# Patient Record
Sex: Male | Born: 1990 | Race: Black or African American | Hispanic: No | Marital: Single | Smoking: Never smoker
Health system: Southern US, Community
[De-identification: ages and names within clinical notes are randomized; demographics above are authoritative.]

---

## 2013-05-25 ENCOUNTER — Emergency Department (HOSPITAL_COMMUNITY)
Admission: EM | Admit: 2013-05-25 | Discharge: 2013-05-25 | Disposition: A | Discharge status: Home / Self Care | Payer: No Typology Code available for payment source | Attending: Emergency Medicine | Admitting: Emergency Medicine

## 2013-05-25 ENCOUNTER — Emergency Department (HOSPITAL_COMMUNITY): Payer: No Typology Code available for payment source

## 2013-05-25 ENCOUNTER — Encounter (HOSPITAL_COMMUNITY): Payer: Self-pay | Admitting: Emergency Medicine

## 2013-05-25 DIAGNOSIS — S0083XA Contusion of other part of head, initial encounter: Secondary | ICD-10-CM

## 2013-05-25 DIAGNOSIS — S060X9A Concussion with loss of consciousness of unspecified duration, initial encounter: Secondary | ICD-10-CM | POA: Insufficient documentation

## 2013-05-25 DIAGNOSIS — Y9389 Activity, other specified: Secondary | ICD-10-CM | POA: Insufficient documentation

## 2013-05-25 DIAGNOSIS — Y92410 Unspecified street and highway as the place of occurrence of the external cause: Secondary | ICD-10-CM | POA: Insufficient documentation

## 2013-05-25 DIAGNOSIS — S0003XA Contusion of scalp, initial encounter: Secondary | ICD-10-CM | POA: Insufficient documentation

## 2013-05-25 MED ORDER — IBUPROFEN 600 MG PO TABS: 600.0000 mg | ORAL_TABLET | Freq: Four times a day (QID) | ORAL | Status: DC | PRN

## 2013-05-25 MED ORDER — METHOCARBAMOL 500 MG PO TABS: 1000.0000 mg | ORAL_TABLET | Freq: Four times a day (QID) | ORAL | Status: DC

## 2013-05-25 MED ORDER — IBUPROFEN 200 MG PO TABS
600.0000 mg | ORAL_TABLET | Freq: Once | ORAL | Status: AC
  Administered 2013-05-25: 600 mg via ORAL
  Filled 2013-05-25: qty 3
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
## 2013-05-28 NOTE — ED Provider Notes (Signed)
Medical screening examination/treatment/procedure(s) were performed by non-physician practitioner and as supervising physician I was immediately available for consultation/collaboration.  EKG Interpretation   None         Megann Easterwood M Rylann Munford, DO 05/28/13 0122 

## 2014-02-28 ENCOUNTER — Emergency Department (HOSPITAL_COMMUNITY)
Admission: EM | Admit: 2014-02-28 | Discharge: 2014-03-01 | Disposition: A | Discharge status: Home / Self Care | Payer: BC Managed Care – PPO | Attending: Emergency Medicine | Admitting: Emergency Medicine

## 2014-02-28 ENCOUNTER — Encounter (HOSPITAL_COMMUNITY): Payer: Self-pay | Admitting: Emergency Medicine

## 2014-02-28 DIAGNOSIS — R112 Nausea with vomiting, unspecified: Secondary | ICD-10-CM | POA: Insufficient documentation

## 2014-02-28 LAB — I-STAT CHEM 8, ED
BUN: 16 mg/dL (ref 6–23)
CALCIUM ION: 1.15 mmol/L (ref 1.12–1.23)
Chloride: 103 mEq/L (ref 96–112)
Creatinine, Ser: 1.1 mg/dL (ref 0.50–1.35)
GLUCOSE: 96 mg/dL (ref 70–99)
HEMATOCRIT: 56 % — AB (ref 39.0–52.0)
HEMOGLOBIN: 19 g/dL — AB (ref 13.0–17.0)
POTASSIUM: 5.1 meq/L (ref 3.7–5.3)
Sodium: 139 mEq/L (ref 137–147)
TCO2: 25 mmol/L (ref 0–100)

## 2014-02-28 MED ORDER — SODIUM CHLORIDE 0.9 % IV BOLUS (SEPSIS)
1000.0000 mL | Freq: Once | INTRAVENOUS | Status: AC
  Administered 2014-02-28: 1000 mL via INTRAVENOUS

## 2014-02-28 MED ORDER — ONDANSETRON HCL 4 MG/2ML IJ SOLN
4.0000 mg | Freq: Once | INTRAMUSCULAR | Status: AC
  Administered 2014-02-28: 4 mg via INTRAVENOUS
  Filled 2014-02-28: qty 2

## 2014-02-28 NOTE — ED Provider Notes (Signed)
CSN: [id number]     Arrival date & time 02/28/14  2048 History   First MD Initiated Contact with Patient 02/28/14 2155     Chief Complaint  Patient presents with  . Emesis     (Consider location/radiation/quality/duration/timing/severity/associated sxs/prior Treatment) The history is provided by the patient and medical records.   This is a 23 y.o. M with no significant past medical history, presenting to the ED for nausea and vomiting, onset 10 AM. He states he has had 20+ episodes of nonbloody, nonbilious emesis since symptom onset, now he is dry heaving. He denies any fever, chills, sweats, or diarrhea. Denies recent sick contacts or changes in diet.  No abdominal pain.  No prior abdominal surgeries.  States he has tried eating crackers and drinking gatorade several times today but cannot hold anything down.  States he does feel somewhat lightheaded but think it is because he has not eaten.  No medications taken PTA.  History reviewed. No pertinent past medical history. History reviewed. No pertinent past surgical history. History reviewed. No pertinent family history. History  Substance Use Topics  . Smoking status: Never Smoker   . Smokeless tobacco: Not on file  . Alcohol Use: No    Review of Systems  Gastrointestinal: Positive for nausea and vomiting.  All other systems reviewed and are negative.     Allergies  Review of patient's allergies indicates no known allergies.  Home Medications   Prior to Admission medications   Not on File   BP 145/86  Pulse 107  Temp(Src) 97.8 F (36.6 C) (Oral)  Resp 18  SpO2 100%  Physical Exam  Nursing note and vitals reviewed. Constitutional: He is oriented to person, place, and time. He appears well-developed and well-nourished. No distress.  HENT:  Head: Normocephalic and atraumatic.  Mouth/Throat: Oropharynx is clear and moist.  Dry mucous membranes  Eyes: Conjunctivae and EOM are normal. Pupils are equal, round, and  reactive to light.  Neck: Normal range of motion.  Cardiovascular: Normal rate, regular rhythm and normal heart sounds.   Pulmonary/Chest: Effort normal and breath sounds normal. No respiratory distress. He has no wheezes.  Abdominal: Soft. Bowel sounds are normal. There is no tenderness. There is no guarding and no CVA tenderness.  Abdomen soft, nondistended, no focal tenderness or peritoneal signs  Musculoskeletal: Normal range of motion.  Neurological: He is alert and oriented to person, place, and time.  Skin: Skin is warm and dry. He is not diaphoretic.  Psychiatric: He has a normal mood and affect.    ED Course  Procedures (including critical care time) Labs Review Labs Reviewed  I-STAT CHEM 8, ED - Abnormal; Notable for the following:    Hemoglobin 19.0 (*)    HCT 56.0 (*)    All other components within normal limits    Imaging Review No results found.   EKG Interpretation None      MDM   Final diagnoses:  Nausea and vomiting, vomiting of unspecified type   23 year old male with nausea vomiting since this morning.  On exam he is afebrile there are nontoxic-appearing. His mucous membranes do appear mildly dry. His abdominal exam is benign. Will plan for fluids, anti-emetics, and chem-8 to check electrolytes.  Electrolytes within normal limits. After fluids and Zofran, patient states he is feeling much better. He tolerated PO gingerale without difficulty.  Abdominal exam remains benign. He'll be discharged home with supportive care. Encouraged fluids, BRAT diet for next 24-48 hours and progressing back to  normal as tolerated. He will follow-up with his primary care physician.  Discussed plan with patient, he/she acknowledged understanding and agreed with plan of care.  Return precautions given for new or worsening symptoms.  Garlon Hatchet, PA-C 03/01/14 [phone]

## 2014-02-28 NOTE — ED Notes (Signed)
Pt complains of vomiting since 10am, he states he's weak and feels like he's going to pass out

## 2014-03-01 MED ORDER — ONDANSETRON HCL 4 MG PO TABS: 4.0000 mg | ORAL_TABLET | Freq: Four times a day (QID) | ORAL | Status: AC

## 2014-03-01 NOTE — Discharge Instructions (Signed)
Take the prescribed medication as directed.  Drink plenty of fluids to stay hydrated. Start with bland diet and progress back to normal as tolerated. Return to the ED for new or worsening symptoms.

## 2014-03-01 NOTE — ED Provider Notes (Signed)
Medical screening examination/treatment/procedure(s) were performed by non-physician practitioner and as supervising physician I was immediately available for consultation/collaboration.   EKG Interpretation None        Saburo Luger F Pepper Kerrick, MD 03/01/14 2256 

## 2014-03-01 NOTE — ED Notes (Signed)
Pt given ginger ale.
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
## 2018-01-05 ENCOUNTER — Encounter
